# Patient Record
Sex: Male | Born: 2000 | Race: White | Hispanic: No | Marital: Single | Smoking: Never smoker
Health system: Southern US, Community
[De-identification: ages and names within clinical notes are randomized; demographics above are authoritative.]

---
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
## 2021-05-20 ENCOUNTER — Emergency Department
Admission: EM | Admit: 2021-05-20 | Discharge: 2021-05-20 | Disposition: A | Discharge status: Home / Self Care | Payer: 59 | Attending: Emergency Medicine | Admitting: Emergency Medicine

## 2021-05-20 ENCOUNTER — Other Ambulatory Visit: Payer: Self-pay

## 2021-05-20 ENCOUNTER — Emergency Department: Payer: 59

## 2021-05-20 DIAGNOSIS — S5001XA Contusion of right elbow, initial encounter: Secondary | ICD-10-CM | POA: Insufficient documentation

## 2021-05-20 DIAGNOSIS — S59901A Unspecified injury of right elbow, initial encounter: Secondary | ICD-10-CM | POA: Diagnosis present

## 2021-05-20 DIAGNOSIS — W268XXA Contact with other sharp object(s), not elsewhere classified, initial encounter: Secondary | ICD-10-CM | POA: Diagnosis not present

## 2021-05-20 DIAGNOSIS — T148XXA Other injury of unspecified body region, initial encounter: Secondary | ICD-10-CM

## 2021-05-20 DIAGNOSIS — S50811A Abrasion of right forearm, initial encounter: Secondary | ICD-10-CM | POA: Insufficient documentation

## 2021-05-20 DIAGNOSIS — Y9339 Activity, other involving climbing, rappelling and jumping off: Secondary | ICD-10-CM | POA: Insufficient documentation

## 2021-05-20 NOTE — ED Triage Notes (Signed)
Pt states was climbing a fence when he injured his right arm. Pt complains of right elbow pain. Pt appears in no acute distress, came in eating ice cream.  ?

## 2021-05-20 NOTE — ED Provider Notes (Signed)
? ?[facility] ?Provider Note ? ? ? Event Date/Time  ? First MD Initiated Contact with Patient 05/20/21 2222   ?  (approximate) ? ? ?History  ? ?Arm Injury ? ? ?HPI ? ?Frederick Henry is a 21 y.o. male who presents today for evaluation of right elbow pain.  Patient was climbing over a fence when he got his arm stuck in the fence and sustained multiple abrasions to his arm.  He reports that he has pain in his elbow so he came into the emergency department for evaluation.  He reports that he is still able to flex and extend his elbow normally.  He denies numbness or tingling.  He did not fall or sustain any head injury.  No other injury sustained.  Reports that he got his tetanus shot 1 year ago. ?  ? ? ?Physical Exam  ? ?Triage Vital Signs: ?ED Triage Vitals  ?Enc Vitals Group  ?   BP 05/20/21 2215 (!) 153/100  ?   Pulse Rate 05/20/21 2215 68  ?   Resp 05/20/21 2215 16  ?   Temp 05/20/21 2215 97.7 ?F (36.5 ?C)  ?   Temp Source 05/20/21 2215 Oral  ?   SpO2 05/20/21 2215 100 %  ?   Weight --   ?   Height --   ?   Head Circumference --   ?   Peak Flow --   ?   Pain Score 05/20/21 2214 7  ?   Pain Loc --   ?   Pain Edu? --   ?   Excl. in GC? --   ? ? ?Most recent vital signs: ?Vitals:  ? 05/20/21 2215  ?BP: (!) 153/100  ?Pulse: 68  ?Resp: 16  ?Temp: 97.7 ?F (36.5 ?C)  ?SpO2: 100%  ? ? ?Physical Exam ?Vitals and nursing note reviewed.  ?Constitutional:   ?   General: Awake and alert. No acute distress. ?   Appearance: Normal appearance. He is well-developed and normal weight.  ?HENT:  ?   Head: Normocephalic and atraumatic.  ?   Mouth/Throat:  ?   Mouth: Mucous membranes are moist.  ?Eyes:  ?   General: PERRL. Normal EOMs     ?   Right eye: No discharge.     ?   Left eye: No discharge.  ?   Conjunctiva/sclera: Conjunctivae normal.  ?Cardiovascular:  ?   Rate and Rhythm: Normal rate and regular rhythm.  ?   Pulses: Normal pulses.  ?   Heart sounds: Normal heart sounds ?Pulmonary:  ?   Effort:  Pulmonary effort is normal. No respiratory distress.  ?   Breath sounds: Normal breath sounds.  ?Abdominal:  ?   Abdomen is soft. There is no abdominal tenderness. No rebound or guarding. No distention. ?Musculoskeletal:     ?   General: No swelling. Normal range of motion.  ?   Cervical back: Normal range of motion and neck supple.  ?   MSK: Right elbow: Mild tenderness to palpation to the elbow, though full flexion and extension with active and passive range of motion.  Able to supinate and pronate against resistance.  Compartment soft and compressible throughout.  Normal intrinsic muscle function of the hand.  Normal radial pulse.  Superficial linear abrasions to forearm. ?Lymphadenopathy:  ?   Cervical: No cervical adenopathy.  ?Skin: ?   General: Skin is warm and dry.  ?   Capillary Refill: Capillary refill takes less than  2 seconds.  ?   Findings: No rash.  ?Neurological:  ?   Mental Status: He is alert.  ?  ? ? ?ED Results / Procedures / Treatments  ? ?Labs ?(all labs ordered are listed, but only abnormal results are displayed) ?Labs Reviewed - No data to display ? ? ?EKG ? ? ? ? ?RADIOLOGY ?I reviewed the patient's x-ray and agree with radiologist findings. ? ? ? ?PROCEDURES: ? ?Critical Care performed:  ? ?Procedures ? ? ?MEDICATIONS ORDERED IN ED: ?Medications - No data to display ? ? ?IMPRESSION / MDM / ASSESSMENT AND PLAN / ED COURSE  ?I reviewed the triage vital signs and the nursing notes. ? ? ?Differential diagnosis includes, but is not limited to, fracture, contusion, abrasion.  Patient is awake and alert, hemodynamically stable and neurovascularly intact.  He has full active and passive range of motion of his elbow, and x-ray does not reveal any acute osseous injury.  His abrasions are superficial and do not require repair.  We discussed wound care and return precautions.  He is quite certain that his tetanus is up-to-date.  Patient was discharged in stable condition.  All questions were answered. ?   ? ? ?FINAL CLINICAL IMPRESSION(S) / ED DIAGNOSES  ? ?Final diagnoses:  ?Contusion of right elbow, initial encounter  ?Abrasion  ? ? ? ?Rx / DC Orders  ? ?ED Discharge Orders   ? ? None  ? ?  ? ? ? ?Note:  This document was prepared using Dragon voice recognition software and may include unintentional dictation errors. ?  ?Jackelyn Hoehn, PA-C ?05/20/21 2305 ? ?  ?Jene Every, MD ?05/20/21 2314 ? ?

## 2021-05-20 NOTE — Discharge Instructions (Signed)
Keep your wounds clean with soap and water.  Please return for any new, worsening, or change in symptoms or other concerns.  It is a pleasure caring for you today. ?

## 2021-09-14 ENCOUNTER — Other Ambulatory Visit: Payer: Self-pay

## 2021-09-14 ENCOUNTER — Emergency Department: Payer: 59

## 2021-09-14 DIAGNOSIS — Z5321 Procedure and treatment not carried out due to patient leaving prior to being seen by health care provider: Secondary | ICD-10-CM | POA: Insufficient documentation

## 2021-09-14 DIAGNOSIS — T162XXA Foreign body in left ear, initial encounter: Secondary | ICD-10-CM | POA: Insufficient documentation

## 2021-09-14 DIAGNOSIS — W3400XA Accidental discharge from unspecified firearms or gun, initial encounter: Secondary | ICD-10-CM | POA: Diagnosis not present

## 2021-09-14 DIAGNOSIS — Y9289 Other specified places as the place of occurrence of the external cause: Secondary | ICD-10-CM | POA: Insufficient documentation
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
## 2021-09-15 ENCOUNTER — Emergency Department
Admission: EM | Admit: 2021-09-15 | Discharge: 2021-09-15 | Disposition: A | Discharge status: Home / Self Care | Payer: 59 | Attending: Emergency Medicine | Admitting: Emergency Medicine

## 2023-01-26 IMAGING — CR DG ELBOW COMPLETE 3+V*R*
1 series · 4 of 4 positions shown · non-contrast
Comparison: None Available.

CLINICAL DATA: Right elbow pain after injury.

EXAM:
RIGHT ELBOW - COMPLETE 3+ VIEW

[Series 1: dg elbow complete right (3+view) · 0.14mm/px · 4 of 4 slices shown]
[im 1/4]
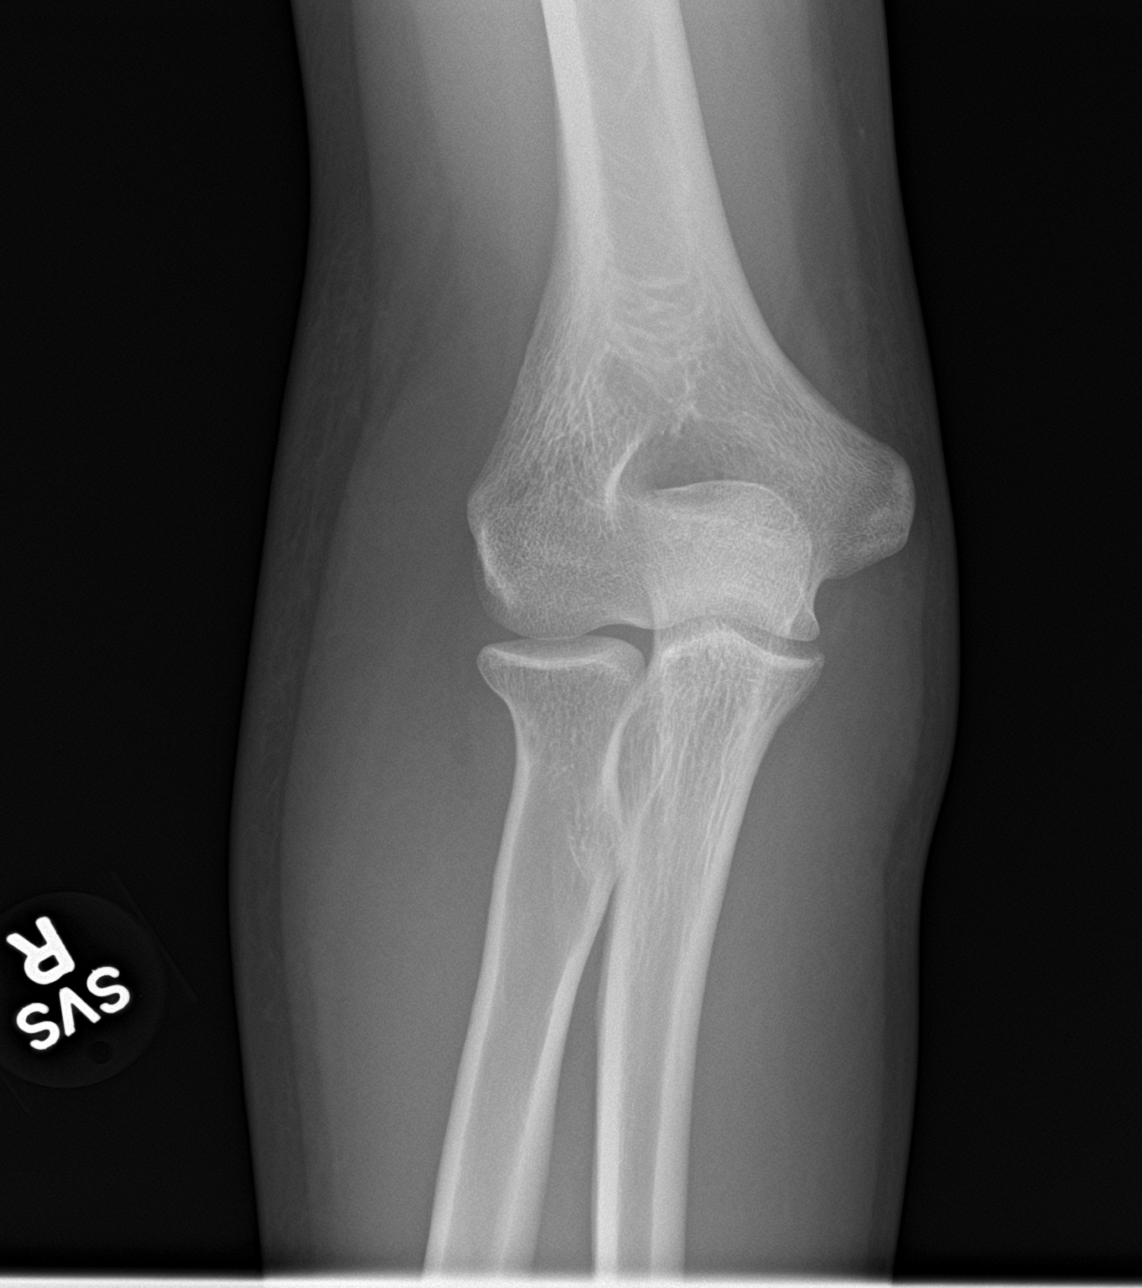
[im 2/4]
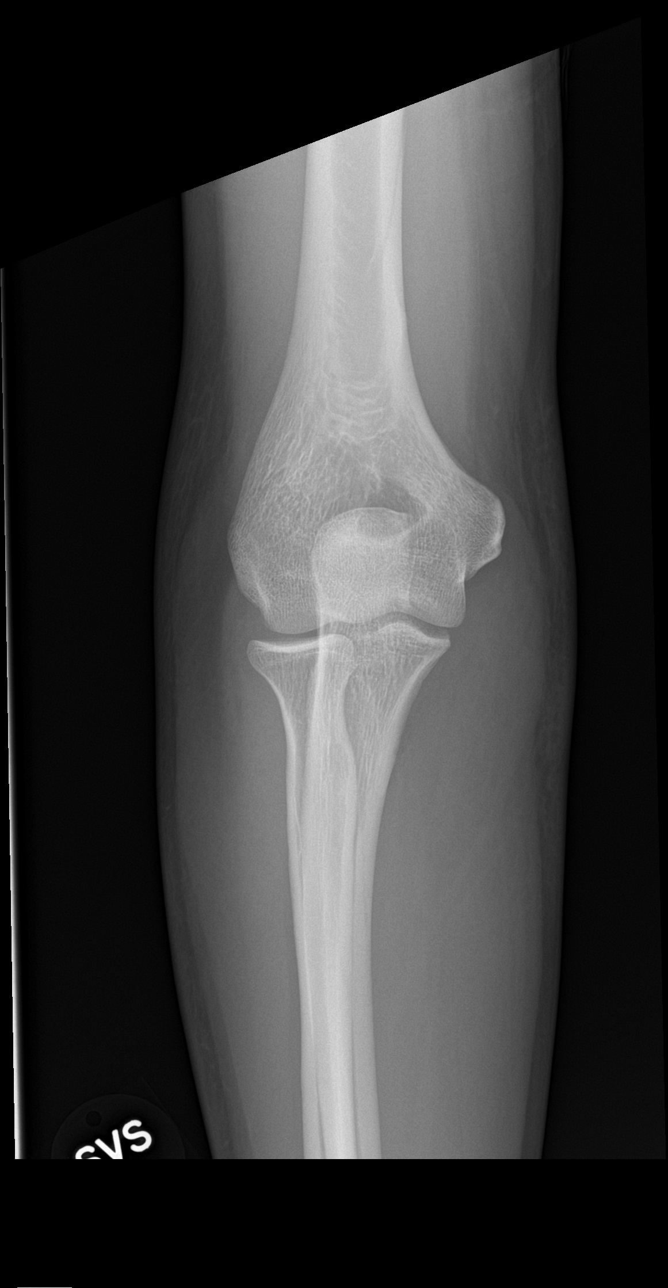
[im 3/4]
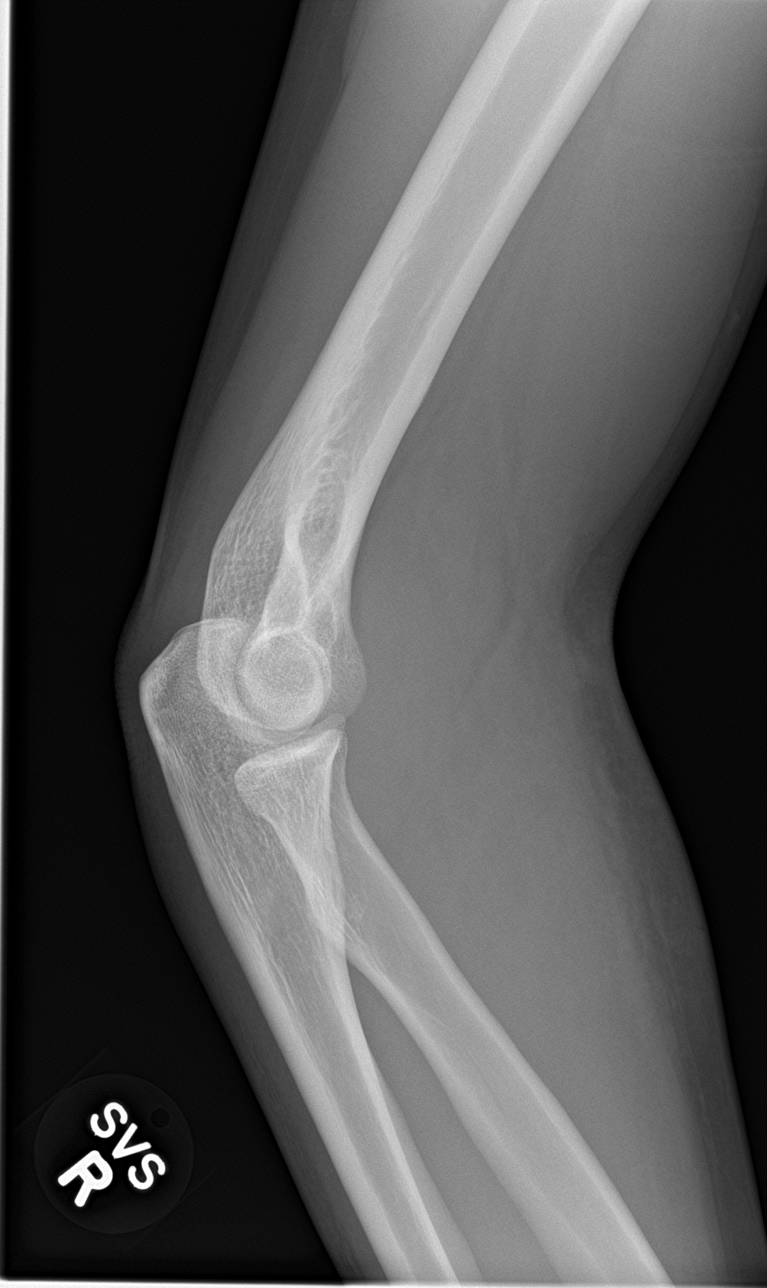
[im 4/4]
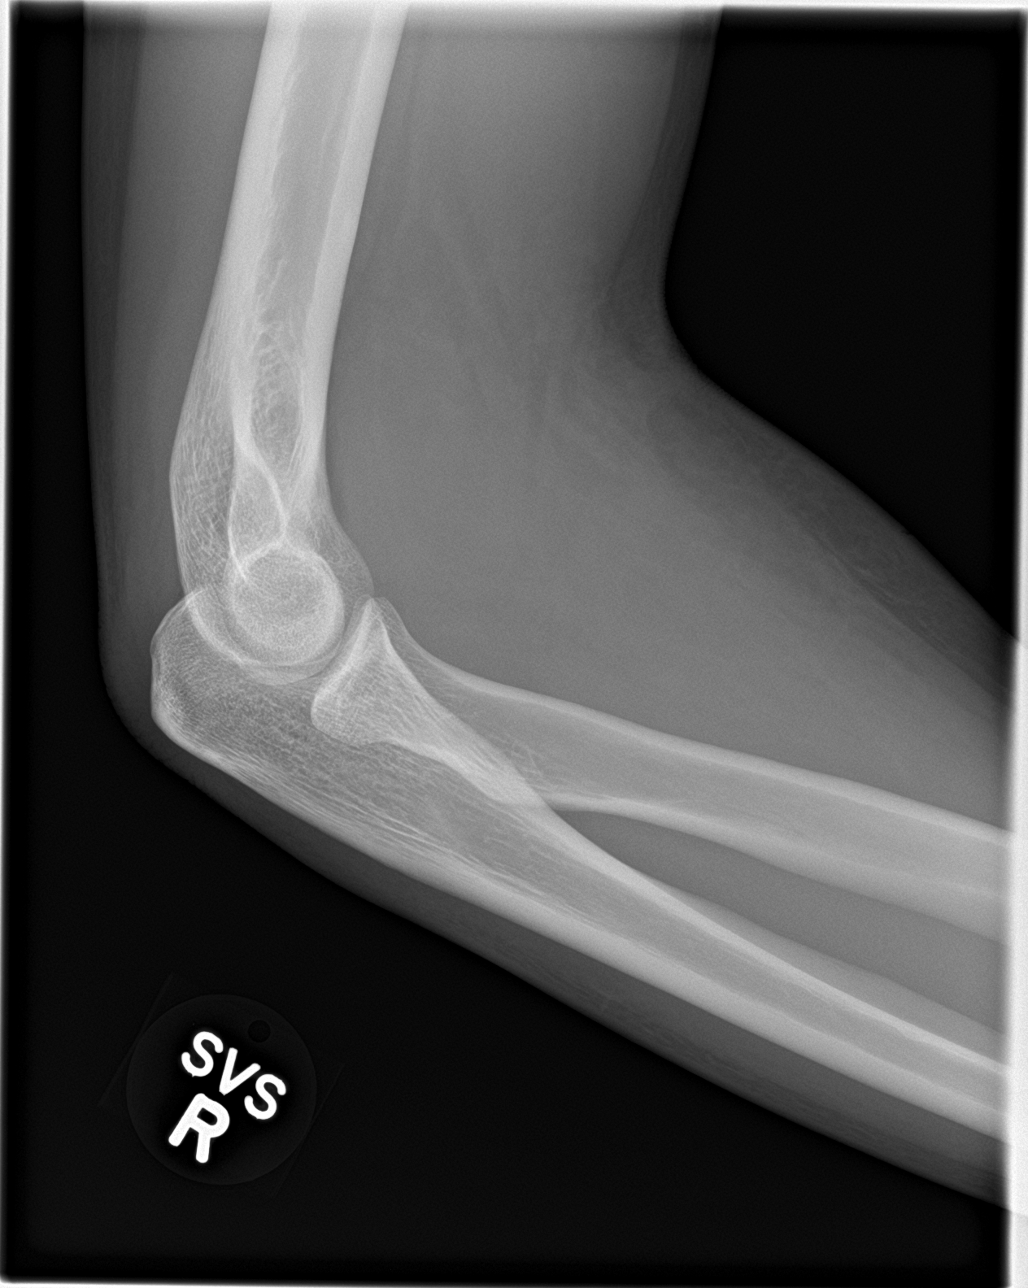

[4 of 4 positions shown; findings below may reference images not displayed]

FINDINGS: There is no evidence of fracture, dislocation, or joint effusion.
Normal alignment. Normal joint spaces. There is no evidence of
arthropathy or other focal bone abnormality. Mild soft tissue edema.
IMPRESSION: Mild soft tissue edema. No fracture or dislocation.
